# Patient Record
Sex: Female | Born: 1935 | Race: White | Hispanic: No | Marital: Married
Health system: Southern US, Community
[De-identification: ages and names within clinical notes are randomized; demographics above are authoritative.]

---

## 2005-09-11 ENCOUNTER — Other Ambulatory Visit: Payer: Self-pay

## 2005-09-11 ENCOUNTER — Emergency Department: Payer: Self-pay | Admitting: Internal Medicine

## 2005-09-14 ENCOUNTER — Ambulatory Visit: Payer: Self-pay | Admitting: Internal Medicine

## 2007-11-21 ENCOUNTER — Emergency Department: Payer: Self-pay

## 2007-11-21 ENCOUNTER — Other Ambulatory Visit: Payer: Self-pay

## 2010-10-02 ENCOUNTER — Emergency Department: Payer: Self-pay | Admitting: Emergency Medicine

## 2014-05-14 ENCOUNTER — Emergency Department: Payer: Self-pay | Admitting: Emergency Medicine

## 2014-06-10 ENCOUNTER — Ambulatory Visit: Payer: Self-pay | Admitting: Unknown Physician Specialty

## 2014-07-03 ENCOUNTER — Ambulatory Visit: Payer: Self-pay | Admitting: Internal Medicine

## 2014-07-07 ENCOUNTER — Inpatient Hospital Stay: Payer: Self-pay | Admitting: Internal Medicine

## 2014-07-07 LAB — COMPREHENSIVE METABOLIC PANEL
ALBUMIN: 2.4 g/dL — AB (ref 3.4–5.0)
ANION GAP: 8 (ref 7–16)
Alkaline Phosphatase: 75 U/L
BILIRUBIN TOTAL: 0.1 mg/dL — AB (ref 0.2–1.0)
BUN: 22 mg/dL — AB (ref 7–18)
CALCIUM: 8.6 mg/dL (ref 8.5–10.1)
CO2: 24 mmol/L (ref 21–32)
Chloride: 100 mmol/L (ref 98–107)
Creatinine: 1.63 mg/dL — ABNORMAL HIGH (ref 0.60–1.30)
EGFR (African American): 31 — ABNORMAL LOW
EGFR (Non-African Amer.): 27 — ABNORMAL LOW
GLUCOSE: 102 mg/dL — AB (ref 65–99)
OSMOLALITY: 268 (ref 275–301)
Potassium: 4.4 mmol/L (ref 3.5–5.1)
SGOT(AST): 14 U/L — ABNORMAL LOW (ref 15–37)
SGPT (ALT): 11 U/L — ABNORMAL LOW
Sodium: 132 mmol/L — ABNORMAL LOW (ref 136–145)
TOTAL PROTEIN: 6.9 g/dL (ref 6.4–8.2)

## 2014-07-07 LAB — URINALYSIS, COMPLETE
BACTERIA: NONE SEEN
BLOOD: NEGATIVE
Bilirubin,UR: NEGATIVE
GLUCOSE, UR: NEGATIVE mg/dL (ref 0–75)
Ketone: NEGATIVE
Leukocyte Esterase: NEGATIVE
NITRITE: NEGATIVE
PH: 5 (ref 4.5–8.0)
RBC,UR: 1 /HPF (ref 0–5)
SQUAMOUS EPITHELIAL: NONE SEEN
Specific Gravity: 1.016 (ref 1.003–1.030)
WBC UR: NONE SEEN /HPF (ref 0–5)

## 2014-07-07 LAB — CBC
HCT: 29 % — AB (ref 35.0–47.0)
HGB: 9 g/dL — ABNORMAL LOW (ref 12.0–16.0)
MCH: 24.4 pg — ABNORMAL LOW (ref 26.0–34.0)
MCHC: 31.1 g/dL — AB (ref 32.0–36.0)
MCV: 79 fL — AB (ref 80–100)
Platelet: 554 10*3/uL — ABNORMAL HIGH (ref 150–440)
RBC: 3.68 10*6/uL — ABNORMAL LOW (ref 3.80–5.20)
RDW: 16.4 % — AB (ref 11.5–14.5)
WBC: 18.3 10*3/uL — AB (ref 3.6–11.0)

## 2014-07-07 LAB — LIPASE, BLOOD: Lipase: 93 U/L (ref 73–393)

## 2014-07-07 LAB — DIFFERENTIAL
BASOS PCT: 0.3 %
Basophil #: 0 10*3/uL (ref 0.0–0.1)
Eosinophil #: 0.2 10*3/uL (ref 0.0–0.7)
Eosinophil %: 1.3 %
LYMPHS ABS: 2.6 10*3/uL (ref 1.0–3.6)
LYMPHS PCT: 14 %
Monocyte #: 1.2 x10 3/mm — ABNORMAL HIGH (ref 0.2–0.9)
Monocyte %: 6.6 %
Neutrophil #: 14.3 10*3/uL — ABNORMAL HIGH (ref 1.4–6.5)
Neutrophil %: 77.8 %

## 2014-07-08 LAB — CBC WITH DIFFERENTIAL/PLATELET
Basophil #: 0.1 10*3/uL (ref 0.0–0.1)
Basophil %: 0.5 %
Eosinophil #: 0.3 10*3/uL (ref 0.0–0.7)
Eosinophil %: 1.7 %
HCT: 26.6 % — ABNORMAL LOW (ref 35.0–47.0)
HGB: 8.9 g/dL — ABNORMAL LOW (ref 12.0–16.0)
Lymphocyte #: 1.9 10*3/uL (ref 1.0–3.6)
Lymphocyte %: 11 %
MCH: 25.9 pg — ABNORMAL LOW (ref 26.0–34.0)
MCHC: 33.4 g/dL (ref 32.0–36.0)
MCV: 78 fL — AB (ref 80–100)
MONOS PCT: 7.3 %
Monocyte #: 1.3 x10 3/mm — ABNORMAL HIGH (ref 0.2–0.9)
NEUTROS ABS: 14 10*3/uL — AB (ref 1.4–6.5)
Neutrophil %: 79.5 %
PLATELETS: 467 10*3/uL — AB (ref 150–440)
RBC: 3.42 10*6/uL — ABNORMAL LOW (ref 3.80–5.20)
RDW: 16.2 % — ABNORMAL HIGH (ref 11.5–14.5)
WBC: 17.7 10*3/uL — AB (ref 3.6–11.0)

## 2014-07-08 LAB — BASIC METABOLIC PANEL
ANION GAP: 7 (ref 7–16)
BUN: 18 mg/dL (ref 7–18)
CHLORIDE: 103 mmol/L (ref 98–107)
Calcium, Total: 8.2 mg/dL — ABNORMAL LOW (ref 8.5–10.1)
Co2: 25 mmol/L (ref 21–32)
Creatinine: 1.41 mg/dL — ABNORMAL HIGH (ref 0.60–1.30)
EGFR (Non-African Amer.): 32 — ABNORMAL LOW
GFR CALC AF AMER: 37 — AB
Glucose: 81 mg/dL (ref 65–99)
Osmolality: 271 (ref 275–301)
Potassium: 4 mmol/L (ref 3.5–5.1)
SODIUM: 135 mmol/L — AB (ref 136–145)

## 2014-07-09 LAB — BASIC METABOLIC PANEL
Anion Gap: 7 (ref 7–16)
BUN: 11 mg/dL (ref 7–18)
CHLORIDE: 104 mmol/L (ref 98–107)
CO2: 25 mmol/L (ref 21–32)
CREATININE: 1.32 mg/dL — AB (ref 0.60–1.30)
Calcium, Total: 7.9 mg/dL — ABNORMAL LOW (ref 8.5–10.1)
GFR CALC AF AMER: 40 — AB
GFR CALC NON AF AMER: 34 — AB
Glucose: 76 mg/dL (ref 65–99)
Osmolality: 270 (ref 275–301)
Potassium: 3.7 mmol/L (ref 3.5–5.1)
Sodium: 136 mmol/L (ref 136–145)

## 2014-07-09 LAB — CBC WITH DIFFERENTIAL/PLATELET
BASOS PCT: 0.6 %
Basophil #: 0.1 10*3/uL (ref 0.0–0.1)
Eosinophil #: 0.4 10*3/uL (ref 0.0–0.7)
Eosinophil %: 2.6 %
HCT: 26 % — ABNORMAL LOW (ref 35.0–47.0)
HGB: 8.5 g/dL — ABNORMAL LOW (ref 12.0–16.0)
LYMPHS ABS: 2 10*3/uL (ref 1.0–3.6)
Lymphocyte %: 13.1 %
MCH: 25.4 pg — AB (ref 26.0–34.0)
MCHC: 32.7 g/dL (ref 32.0–36.0)
MCV: 77 fL — ABNORMAL LOW (ref 80–100)
MONO ABS: 1.1 x10 3/mm — AB (ref 0.2–0.9)
Monocyte %: 7.1 %
NEUTROS PCT: 76.6 %
Neutrophil #: 11.5 10*3/uL — ABNORMAL HIGH (ref 1.4–6.5)
PLATELETS: 476 10*3/uL — AB (ref 150–440)
RBC: 3.36 10*6/uL — AB (ref 3.80–5.20)
RDW: 16.6 % — ABNORMAL HIGH (ref 11.5–14.5)
WBC: 15.1 10*3/uL — AB (ref 3.6–11.0)

## 2014-07-09 LAB — URINE CULTURE

## 2014-07-10 LAB — CEA: CEA: 8 ng/mL — ABNORMAL HIGH (ref 0.0–4.7)

## 2014-07-12 LAB — CULTURE, BLOOD (SINGLE)

## 2014-08-03 ENCOUNTER — Ambulatory Visit: Payer: Self-pay | Admitting: Internal Medicine

## 2015-01-25 IMAGING — CR DG HIP COMPLETE 2+V*L*
1 series · 3 of 3 positions shown · non-contrast
Comparison: None.

CLINICAL DATA: Left hip pain 3 weeks.  No injury.

EXAM:
LEFT HIP - COMPLETE 2+ VIEW

[Series 1: t pelvis ap · 0.14mm/px · 3 of 3 slices shown]
[im 1/3]
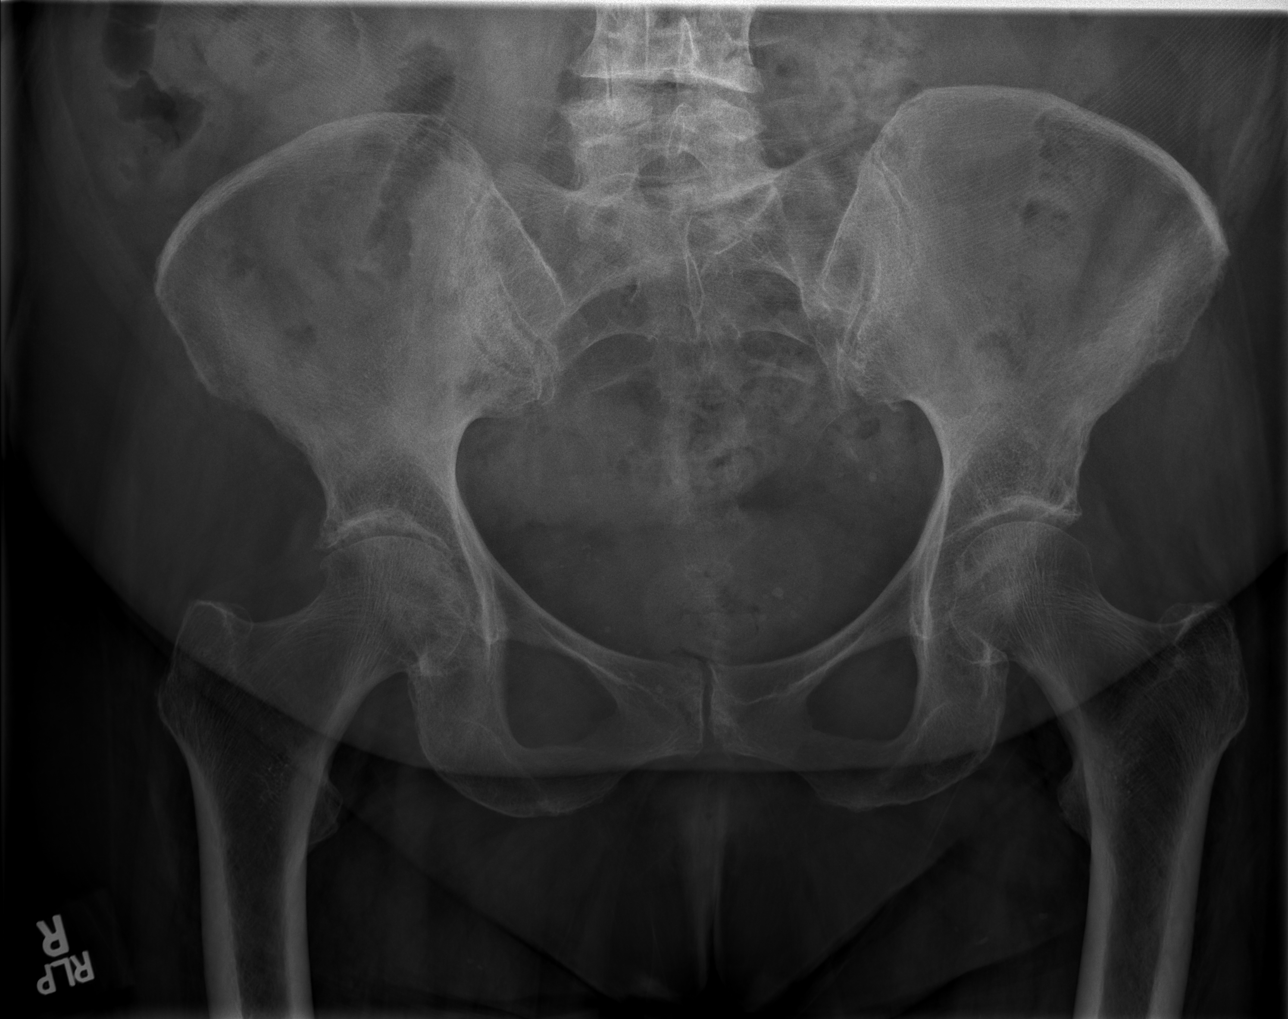
[im 2/3]
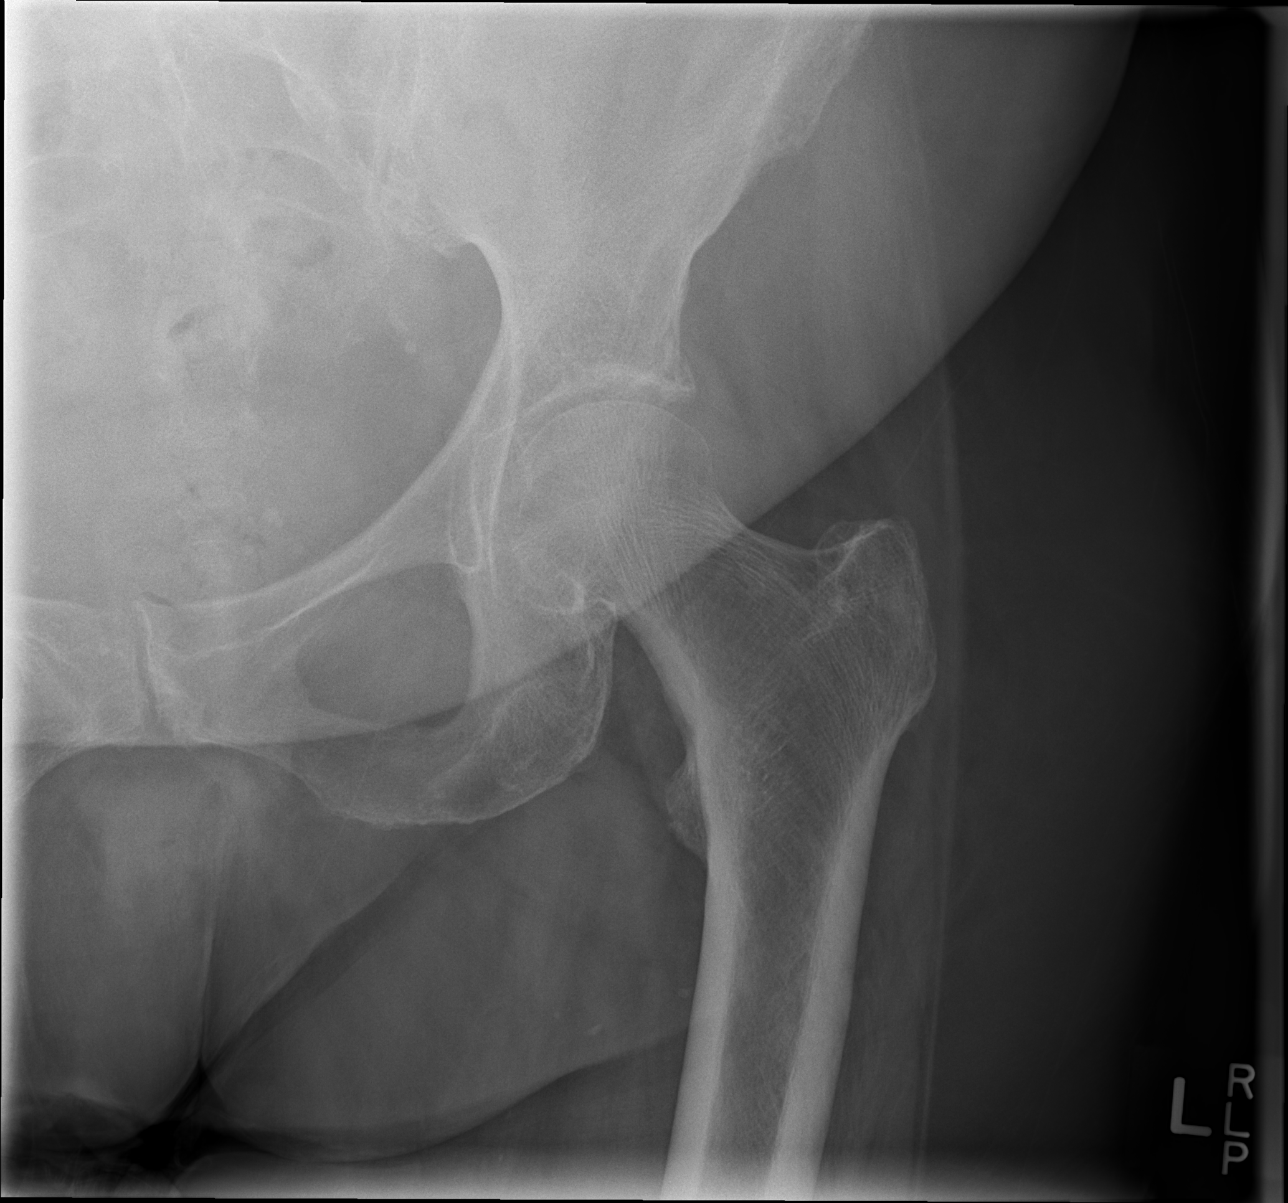
[im 3/3]
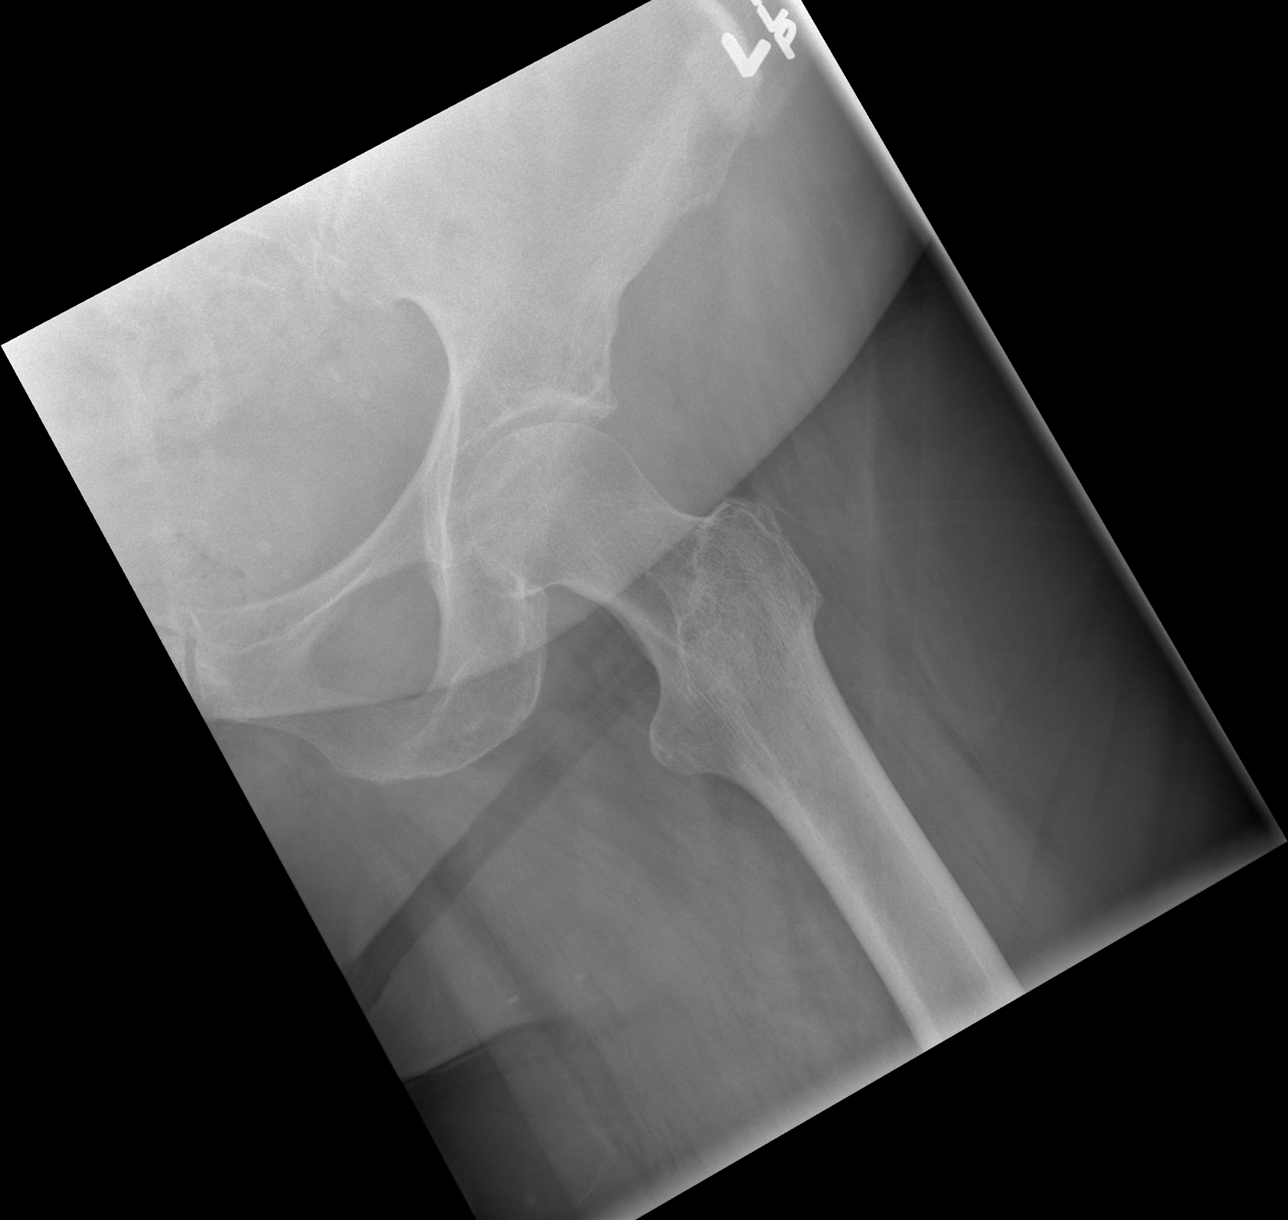

[3 of 3 positions shown; findings below may reference images not displayed]

FINDINGS: There is mild diffuse decreased bone mineralization. There are mild
degenerative changes of the hips right worse than left. There is no
acute fracture or dislocation. There are mild degenerative changes
of the spine.
IMPRESSION: No acute findings.

Mild degenerative change of the hips right greater than left.

## 2015-03-20 IMAGING — CT CT STONE STUDY
2 of 4 series · 17 of 46 positions shown, 19 images · non-contrast
Comparison: None.

CLINICAL DATA: Left flank pain

EXAM:
CT ABDOMEN AND PELVIS WITHOUT CONTRAST
TECHNIQUE: Multidetector CT imaging of the abdomen and pelvis was performed
following the standard protocol without IV contrast.

[Series 2: stone standard full · axial · 0.88mm/px · z∈[-1337,-877]mm · 14 of 102 slices shown, 16 images]
[im 5/102  soft-tissue]
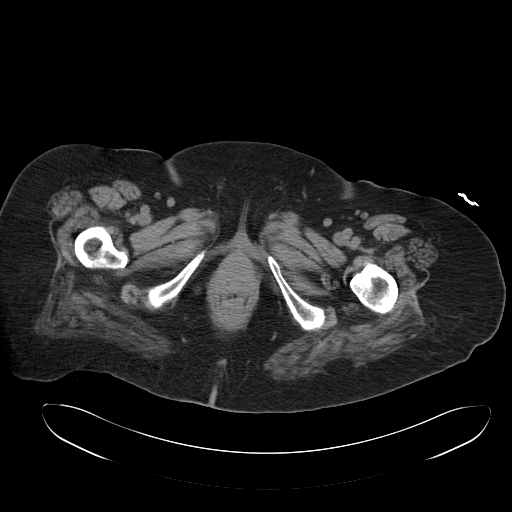
[im 5/102  bone]
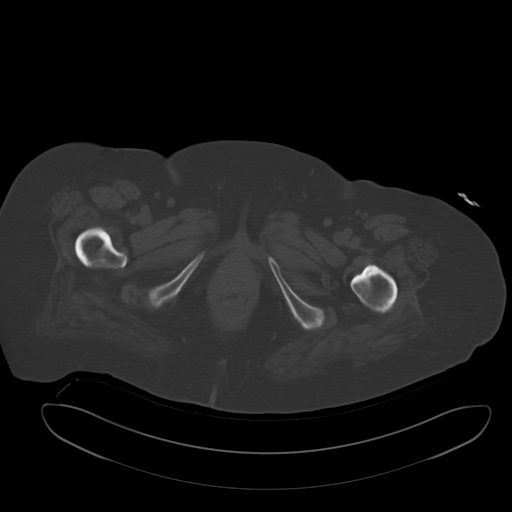
[im 13/102  soft-tissue]
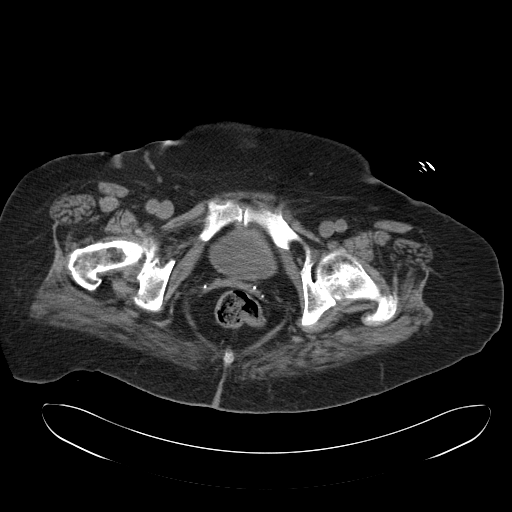
[im 21/102  soft-tissue]
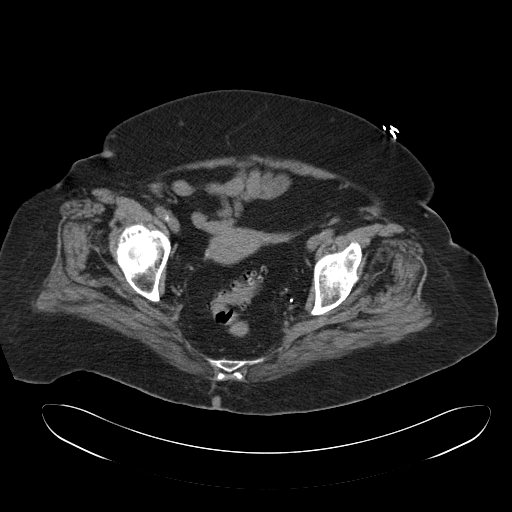
[im 29/102  soft-tissue]
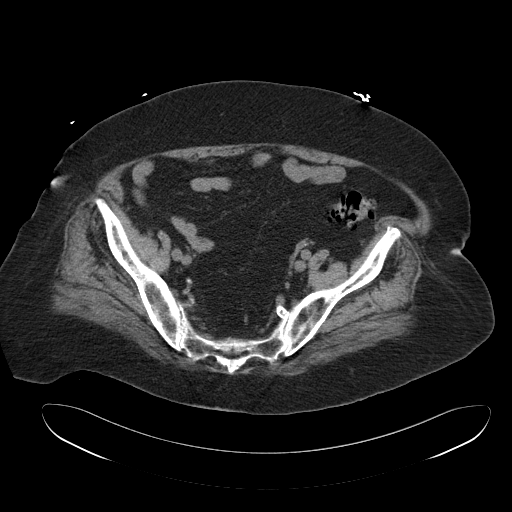
[im 33/102  soft-tissue]
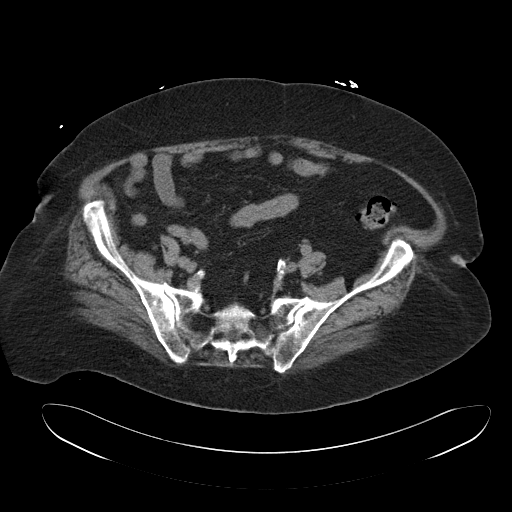
[im 41/102  soft-tissue]
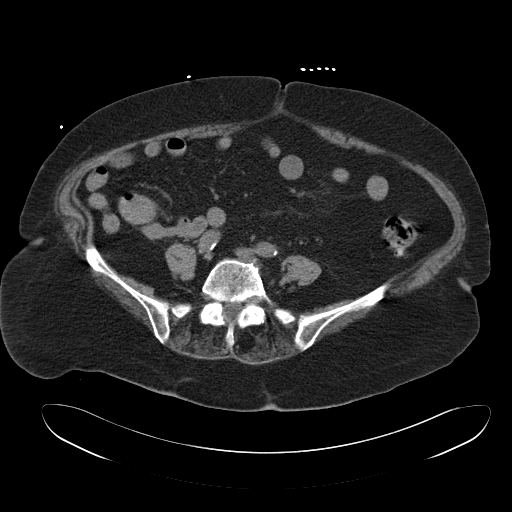
[im 49/102  soft-tissue]
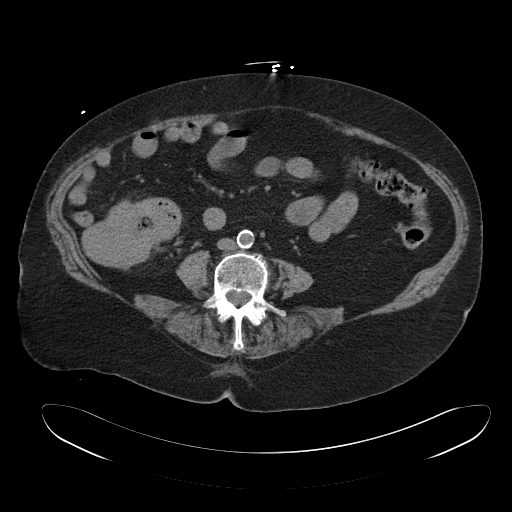
[im 53/102  soft-tissue]
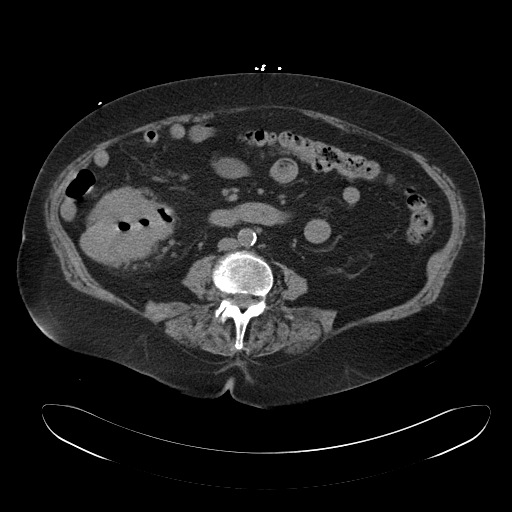
[im 61/102  soft-tissue]
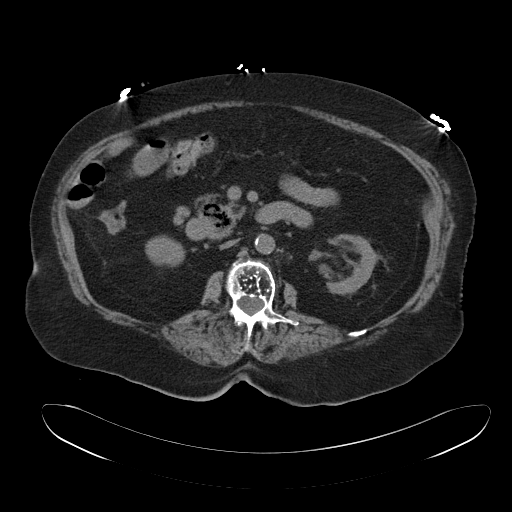
[im 61/102  bone]
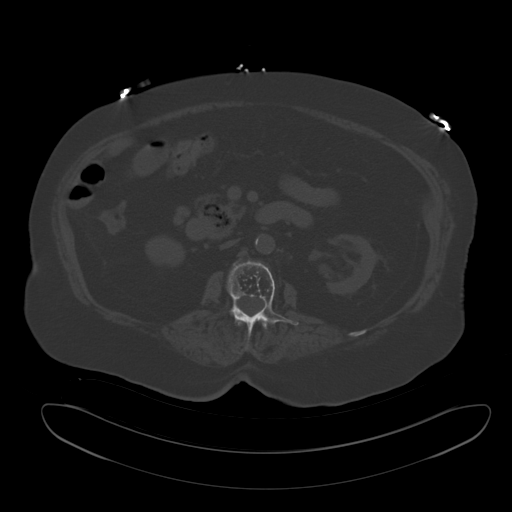
[im 69/102  soft-tissue]
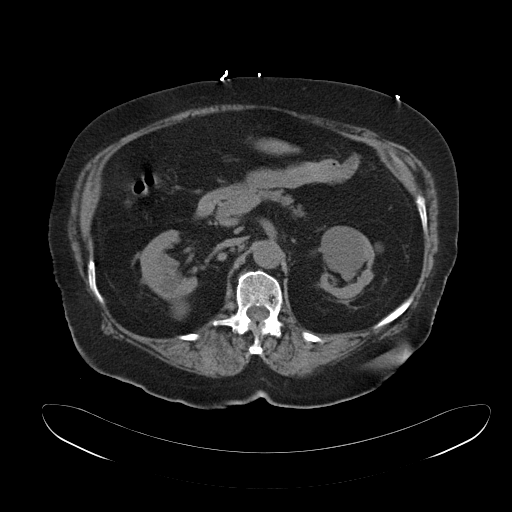
[im 77/102  soft-tissue]
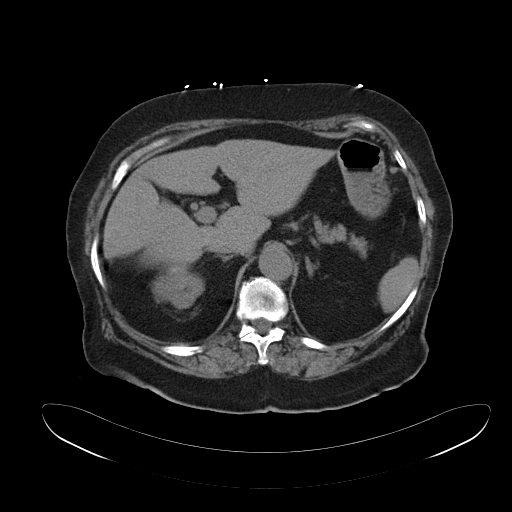
[im 81/102  soft-tissue]
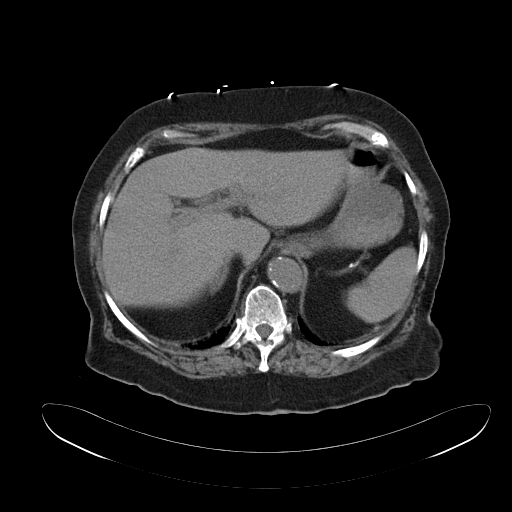
[im 89/102  soft-tissue]
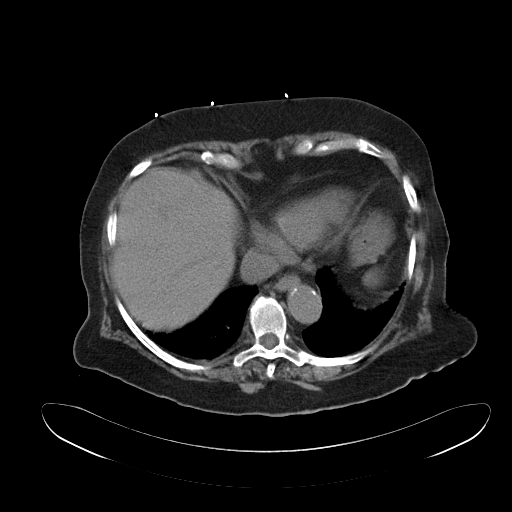
[im 97/102  soft-tissue]
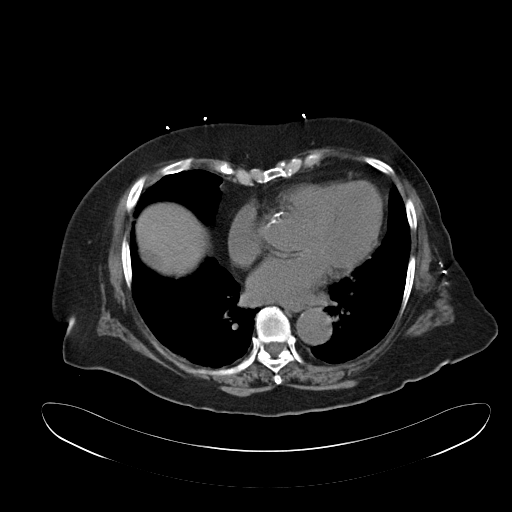

[Series 5: cor stone standard full · coronal · 0.95mm/px · 3 of 150 slices shown]
[im 50/150  soft-tissue]
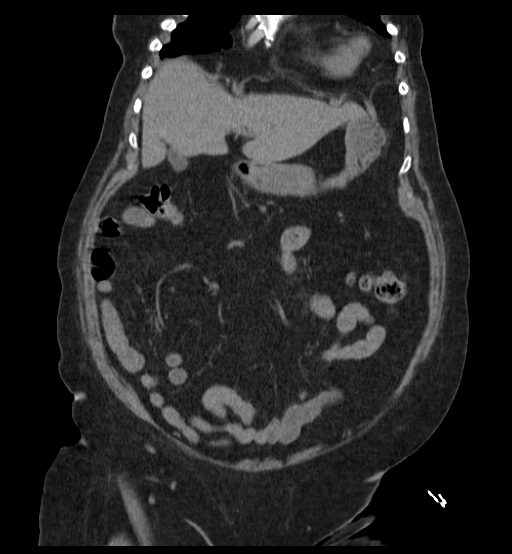
[im 67/150  soft-tissue]
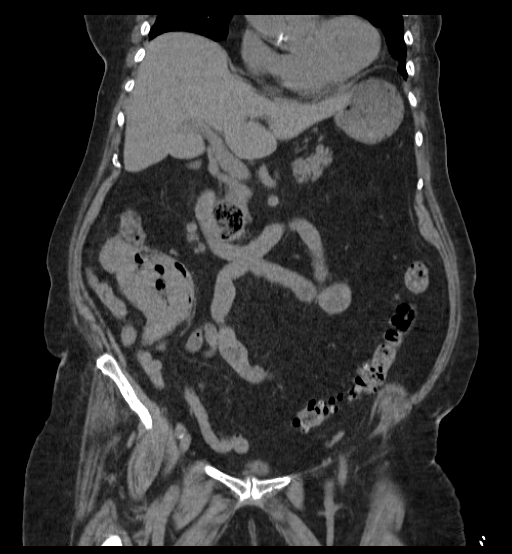
[im 83/150  soft-tissue]
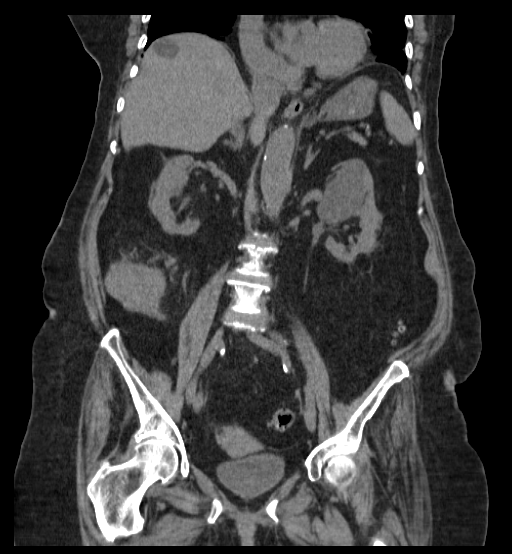

[17 of 46 positions shown; findings below may reference images not displayed]

FINDINGS: 5 mm nodule in the lingula. No infiltrate or effusion. Coronary
artery calcification.

2 cm cyst in the dome of the liver. No other liver mass identified.
Gallbladder is contracted. Bile ducts are nondilated. Atrophic
pancreas without edema or mass. Spleen is not enlarged.

Bilateral renal cysts.  No renal obstruction or stone.

Large mass involving the cecum measuring 6.4 x 7.7 cm. Bowel gas is
seen within the lumen. This is not causing bowel obstruction.
Several 1 cm lymph nodes are present posterior to the mass. Sigmoid
diverticulosis. No free fluid.

Hemangioma L2 vertebral body.  No vertebral fracture.
IMPRESSION: 5 mm nodule in the lingula. Given the cecal mass, chest CT suggested
to evaluate for other nodules.

Large cecal mass with adjacent lymph nodes. Findings are most
consistent with cecal carcinoma.

Sigmoid diverticulosis.

## 2015-03-26 NOTE — Discharge Summary (Signed)
PATIENT NAME:  Angelica [name], Angelica [name] MR#:  [id number] DATE OF BIRTH:  03/29/1918  DATE OF ADMISSION:  07/07/2014 DATE OF TRANSFER TO HOSPICE CARE CENTER:  07/09/2014  ADMITTING DIAGNOSIS: Abdominal pain.   DISCHARGE DIAGNOSES:  1. Abdominal pain due to [name] cecal mass.  2. Hypothyroidism.  3. Hypertension.  4. Chronic back pain,  5. Status post left mastectomy for breast cancers.   CONSULTANTS: Palliative care.   PERTINENT LABS AND EVALUATIONS: Admitting glucose 101, BUN 22, creatinine 1.63, sodium 132, potassium 4.4, chloride 100, CO2 24, calcium is 8.6. LFTs showed an albumin of 2.4, bilirubin total 0.1, AST was 14, ALT 11. WBC count was 18.3. Hemoglobin 9, platelet count 554,000.   CT of the abdomen shows large cecal mass with adjacent lymph node. 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   HOSPITAL COURSE: Please refer to H and P done by admitting physician. The patient is [name] 81 year old white female with history of above-stated medical problems, who presented with abdominal pain, nausea. The patient has never had [name] colonoscopy in the past. She was seen in the ED for these symptoms and had [name] CT scan which was evaluated in the ED and the patient was noted to have [name] large mass concerning for colon cancer. The patient was admitted for symptom control and further evaluation. Based on her age and further conversation with the family it was felt that she would benefit from palliative care consult. She was seen by Dr. Harvie JuniorPhifer and it was discussed with the family and she was made comfort care with transfer to hospice home.   DISCHARGE MEDICATIONS: Fish oil 1000 mg q. daily, eye health formula 1 tab p.o. q. daily, vitamin D2 50,000 international units once [name] week, calcium plus vitamin D 1 tab p.o. q. daily, gabapentin 300 at bedtime, metoprolol 100 mg 1 tab p.o. b.i.d., iron sulfate 325 mg 1 tab p.o. daily, amlodipine 2.5 q. daily, levothyroxine 125 mcg q. daily, lisinopril 20 q. daily, morphine 20  mg/mL 0.25 to 0.5 mL q. 1 to 2 hours as needed for pain, lorazepam 0.5, one to two tabs some sublingually every 1 to 2 hours as needed for anxiety or agitation.   TIME SPENT ON THIS DISCHARGE:  35 minutes.    ____________________________ Lacie ScottsShreyang H. Allena KatzPatel, MD shp:jh D: 07/09/2014 14:14:09 ET T: 07/09/2014 22:59:56 ET JOB#: [id number]  cc: Maliha Outten H. Allena KatzPatel, MD, <Dictator> Charise CarwinSHREYANG H Ken Bonn MD ELECTRONICALLY SIGNED 07/14/2014 10:38

## 2015-03-26 NOTE — H&P (Signed)
PATIENT NAME:  Angelica, [name] MR#:  [id number] DATE OF BIRTH:  [birth year]  DATE OF ADMISSION:  07/07/2014  PRIMARY CARE PHYSICIAN: Dr. Mickey Farber.   REFERRING PHYSICIAN: Dr. Margarita Grizzle.   CHIEF COMPLAINT: Left flank and abdominal pain.   HISTORY OF PRESENT ILLNESS: Angelica [name] is a 81 year old female with history of hypothyroidism, chronic pain, hypertension, presented to the Emergency Department with complaints of left flank pain radiating into the left groin area. The patient today went to eat lunch; however, started to complain of severe pain. The patient called herself the EMS and was brought to the Emergency Department. The patient was found to have some hematochezia about a month back. Denies having any nausea, vomiting. There was some weight loss. Work-up in the Emergency Department: CT abdomen and pelvis revealed large cecal mass 6.4 x 7.7 cm without any obstructive symptoms. The patient is also noted to have elevated WBC count of 18,000. The patient denies having any diarrhea and some renal insufficiency. We do not have any labs to compare. Concerning about the patient's home situation, the decision is made to admit the patient for further work-up and the patient is also agreeable for further work-up. The patient never had a colonoscopy. Denies having any nausea or vomiting.   PAST MEDICAL HISTORY:  1. Chronic pain.  2. Hypothyroidism.  3. Hypertension.  4. Left mastectomy for breast cancer.   ALLERGIES: No known drug allergies.   HOME MEDICATIONS:  1. Vicodin. 2. Synthroid. 3. Norvasc.  4. Mobic.   5. Metoprolol.  6. Lisinopril.  7. Fish oil. 8. Calcium with vitamin D.  9. Norco.   SOCIAL HISTORY: No history of smoking, drinking alcohol or using illicit drugs. Lives with her son.   FAMILY HISTORY: Noncontributory at the age of 12.   REVIEW OF SYSTEMS:  CONSTITUTIONAL: Has been experiencing generalized weakness.  EYES: No change in vision.  EARS, NOSE AND THROAT:  No  change in hearing.  RESPIRATORY: No cough, shortness of breath.  CARDIOVASCULAR: No chest pain, palpations.  GASTROINTESTINAL: Has somewhat decreased appetite, having regular bowel movements.  GENITOURINARY: No dysuria or hematochezia.  HEMATOLOGIC: No easy bruising or bleeding.  SKIN: No rashes or lesions.  MUSCULOSKELETAL: Has history of arthritis.  NEUROLOGIC: No weakness or numbness in any part of the body.   PHYSICAL EXAMINATION:  GENERAL: This is a well-built, well-nourished, age-appropriate female lying down in the bed, not in distress.  VITAL SIGNS: Temperature 98.4, pulse 69, blood pressure 143/82, respiratory rate of 22, oxygen saturation 98% on room air.  HEENT: Head normocephalic, atraumatic. There is no scleral icterus. Conjunctivae normal. Pupils equal and reactive to light. Extraocular movements are intact. Mucous membranes moist. No pharyngeal erythema.  NECK: Supple. No lymphadenopathy. No JVD. No carotid bruit.  CHEST: Has no focal tenderness.  LUNGS: Bilaterally clear to auscultation.  HEART: S1, S2 regular. No murmurs are heard.  ABDOMEN: Bowel sounds present. Soft. Has nontender, nondistended.  EXTREMITIES: No pedal edema. Pulses 2+.  SKIN: No rash or lesions.  MUSCULOSKELETAL: Good range of motion in all the extremities.  NEUROLOGIC: The patient is hard of hearing, however, oriented to place, person, and time. Cranial nerves II through XII intact. Motor 5/5 in upper and lower extremities.   LABORATORY DATA: CBC: WBC of 18.3, hemoglobin 9,  platelet count of 554,000.   COMPLETE METABOLIC PANEL: BUN 22, creatinine of 1.6. The rest of all the values are within normal limits.   CT abdomen and pelvis without contrast showed 5  mm nodule in the lingula. Given the cecal mass, chest CT suggested to evaluate other nodules. Large cecal mass with adjacent lymph nodes consistent with cecal carcinoma. Urinalysis is negative for nitrites and leukocyte esterase.   ASSESSMENT AND  PLAN: Angelica [name] is a 81 year old female with moderate functional status who walks with the help of a walker, is found to have large cecal mass concerning about malignancy.  1. Cecal mass, newly diagnosed.  The patient prefers to have this further investigated. We will consult GI for possible colonoscopy and  biopsy. Keep the patient on clear liquids until being evaluated by GI. Once we have a tissue diagnosis will defer the patient for oncology for further treatment based on the patient's functional capacity.  2. Hypothyroidism. Continue Synthroid.  3. Hypertension. Blood pressure is well controlled. Continue the current regimen.  4. Chronic back pain. Continue with Norco as needed.  5. Keep the patient on deep vein thrombosis prophylaxis with Lovenox.   TIME SPENT: 50 minutes.    ____________________________ Susa GriffinsPadmaja Dora Clauss, MD pv:jh D: 07/08/2014 00:48:33 ET T: 07/08/2014 01:23:01 ET JOB#: [id number]  cc: Susa GriffinsPadmaja Lendora Keys, MD, <Dictator> Susa GriffinsPADMAJA Giovanna Kemmerer MD ELECTRONICALLY SIGNED 07/08/2014 21:13
